# Patient Record
Sex: Female | Born: 1985 | Race: Black or African American | Hispanic: No | Smoking: Never smoker
Health system: Southern US, Community
[De-identification: ages and names within clinical notes are randomized; demographics above are authoritative.]

---
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
## 2009-10-27 ENCOUNTER — Emergency Department (HOSPITAL_COMMUNITY): Admission: EM | Admit: 2009-10-27 | Discharge: 2009-10-27 | Payer: Self-pay | Admitting: Emergency Medicine

## 2010-01-19 ENCOUNTER — Inpatient Hospital Stay (HOSPITAL_COMMUNITY)
Admission: AD | Admit: 2010-01-19 | Discharge: 2010-01-19 | Payer: Self-pay | Source: Home / Self Care | Attending: Obstetrics & Gynecology | Admitting: Obstetrics & Gynecology

## 2010-01-28 ENCOUNTER — Inpatient Hospital Stay (HOSPITAL_COMMUNITY)
Admission: AD | Admit: 2010-01-28 | Discharge: 2010-01-28 | Payer: Self-pay | Source: Home / Self Care | Attending: Family Medicine | Admitting: Family Medicine

## 2010-02-03 LAB — URINALYSIS, ROUTINE W REFLEX MICROSCOPIC
Bilirubin Urine: NEGATIVE
Ketones, ur: NEGATIVE mg/dL
Leukocytes, UA: NEGATIVE
Nitrite: NEGATIVE
Protein, ur: NEGATIVE mg/dL
Specific Gravity, Urine: 1.025 (ref 1.005–1.030)
Urine Glucose, Fasting: NEGATIVE mg/dL
Urobilinogen, UA: 0.2 mg/dL (ref 0.0–1.0)
pH: 6.5 (ref 5.0–8.0)

## 2010-02-03 LAB — URINE MICROSCOPIC-ADD ON

## 2010-02-03 LAB — WET PREP, GENITAL
Clue Cells Wet Prep HPF POC: NONE SEEN
Trich, Wet Prep: NONE SEEN
Yeast Wet Prep HPF POC: NONE SEEN

## 2010-02-10 ENCOUNTER — Inpatient Hospital Stay (HOSPITAL_COMMUNITY)
Admission: AD | Admit: 2010-02-10 | Discharge: 2010-02-10 | Payer: Self-pay | Source: Home / Self Care | Attending: Obstetrics and Gynecology | Admitting: Obstetrics and Gynecology

## 2010-02-11 LAB — URINE MICROSCOPIC-ADD ON

## 2010-02-11 LAB — WET PREP, GENITAL
Clue Cells Wet Prep HPF POC: NONE SEEN
Trich, Wet Prep: NONE SEEN
Yeast Wet Prep HPF POC: NONE SEEN

## 2010-02-11 LAB — URINALYSIS, ROUTINE W REFLEX MICROSCOPIC
Bilirubin Urine: NEGATIVE
Ketones, ur: NEGATIVE mg/dL
Nitrite: NEGATIVE
Protein, ur: NEGATIVE mg/dL
Specific Gravity, Urine: 1.025 (ref 1.005–1.030)
Urine Glucose, Fasting: NEGATIVE mg/dL
Urobilinogen, UA: 0.2 mg/dL (ref 0.0–1.0)
pH: 7.5 (ref 5.0–8.0)

## 2010-02-11 LAB — FETAL FIBRONECTIN: Fetal Fibronectin: NEGATIVE

## 2010-03-31 LAB — URINE MICROSCOPIC-ADD ON

## 2010-03-31 LAB — URINALYSIS, ROUTINE W REFLEX MICROSCOPIC
Glucose, UA: NEGATIVE mg/dL
Hgb urine dipstick: NEGATIVE
Ketones, ur: 15 mg/dL — AB
Nitrite: NEGATIVE
Protein, ur: NEGATIVE mg/dL
Specific Gravity, Urine: 1.02 (ref 1.005–1.030)
Urobilinogen, UA: 2 mg/dL — ABNORMAL HIGH (ref 0.0–1.0)
pH: 6 (ref 5.0–8.0)

## 2010-04-03 LAB — DIFFERENTIAL
Basophils Absolute: 0 10*3/uL (ref 0.0–0.1)
Basophils Relative: 0 % (ref 0–1)
Eosinophils Absolute: 0.1 10*3/uL (ref 0.0–0.7)
Eosinophils Relative: 1 % (ref 0–5)
Lymphocytes Relative: 19 % (ref 12–46)
Lymphs Abs: 2.2 10*3/uL (ref 0.7–4.0)
Monocytes Absolute: 1.2 10*3/uL — ABNORMAL HIGH (ref 0.1–1.0)
Monocytes Relative: 11 % (ref 3–12)
Neutro Abs: 8 10*3/uL — ABNORMAL HIGH (ref 1.7–7.7)
Neutrophils Relative %: 70 % (ref 43–77)

## 2010-04-03 LAB — URINALYSIS, ROUTINE W REFLEX MICROSCOPIC
Bilirubin Urine: NEGATIVE
Glucose, UA: NEGATIVE mg/dL
Hgb urine dipstick: NEGATIVE
Ketones, ur: NEGATIVE mg/dL
Nitrite: NEGATIVE
Protein, ur: NEGATIVE mg/dL
Specific Gravity, Urine: 1.013 (ref 1.005–1.030)
Urobilinogen, UA: 0.2 mg/dL (ref 0.0–1.0)
pH: 8.5 — ABNORMAL HIGH (ref 5.0–8.0)

## 2010-04-03 LAB — CBC
HCT: 34.2 % — ABNORMAL LOW (ref 36.0–46.0)
Hemoglobin: 11.5 g/dL — ABNORMAL LOW (ref 12.0–15.0)
MCH: 32.5 pg (ref 26.0–34.0)
MCHC: 33.6 g/dL (ref 30.0–36.0)
MCV: 96.6 fL (ref 78.0–100.0)
Platelets: 357 10*3/uL (ref 150–400)
RBC: 3.54 MIL/uL — ABNORMAL LOW (ref 3.87–5.11)
RDW: 12 % (ref 11.5–15.5)
WBC: 11.5 10*3/uL — ABNORMAL HIGH (ref 4.0–10.5)

## 2010-04-03 LAB — BASIC METABOLIC PANEL
BUN: 10 mg/dL (ref 6–23)
CO2: 24 mEq/L (ref 19–32)
Calcium: 8.9 mg/dL (ref 8.4–10.5)
Chloride: 104 mEq/L (ref 96–112)
Creatinine, Ser: 0.72 mg/dL (ref 0.4–1.2)
GFR calc Af Amer: >60 mL/min (ref >60)
GFR calc non Af Amer: >60 mL/min (ref >60)
Glucose, Bld: 85 mg/dL (ref 70–99)
Potassium: 3.5 mEq/L (ref 3.5–5.1)
Sodium: 133 mEq/L — ABNORMAL LOW (ref 135–145)

## 2010-04-03 LAB — HCG, QUANTITATIVE, PREGNANCY: hCG, Beta Chain, Quant, S: 196558 m[IU]/mL — ABNORMAL HIGH (ref <5)

## 2010-04-03 LAB — ABO/RH: ABO/RH(D): A POS

## 2010-04-27 ENCOUNTER — Inpatient Hospital Stay (HOSPITAL_COMMUNITY)
Admission: AD | Admit: 2010-04-27 | Discharge: 2010-04-28 | Disposition: A | Discharge status: Home / Self Care | Payer: BC Managed Care – HMO | Source: Ambulatory Visit | Attending: Obstetrics & Gynecology | Admitting: Obstetrics & Gynecology

## 2010-04-27 DIAGNOSIS — O99891 Other specified diseases and conditions complicating pregnancy: Secondary | ICD-10-CM | POA: Insufficient documentation

## 2010-04-27 DIAGNOSIS — Y92009 Unspecified place in unspecified non-institutional (private) residence as the place of occurrence of the external cause: Secondary | ICD-10-CM | POA: Insufficient documentation

## 2010-04-27 DIAGNOSIS — R109 Unspecified abdominal pain: Secondary | ICD-10-CM | POA: Insufficient documentation

## 2010-04-27 DIAGNOSIS — O9989 Other specified diseases and conditions complicating pregnancy, childbirth and the puerperium: Secondary | ICD-10-CM

## 2010-04-27 DIAGNOSIS — W010XXA Fall on same level from slipping, tripping and stumbling without subsequent striking against object, initial encounter: Secondary | ICD-10-CM | POA: Insufficient documentation

## 2010-04-27 LAB — COMPREHENSIVE METABOLIC PANEL
ALT: 20 U/L (ref 0–35)
AST: 22 U/L (ref 0–37)
Albumin: 2.7 g/dL — ABNORMAL LOW (ref 3.5–5.2)
Alkaline Phosphatase: 109 U/L (ref 39–117)
BUN: 11 mg/dL (ref 6–23)
CO2: 22 mEq/L (ref 19–32)
Calcium: 8.6 mg/dL (ref 8.4–10.5)
Chloride: 101 mEq/L (ref 96–112)
Creatinine, Ser: 0.73 mg/dL (ref 0.4–1.2)
GFR calc Af Amer: >60 mL/min (ref >60)
GFR calc non Af Amer: >60 mL/min (ref >60)
Glucose, Bld: 83 mg/dL (ref 70–99)
Potassium: 3.4 mEq/L — ABNORMAL LOW (ref 3.5–5.1)
Sodium: 131 mEq/L — ABNORMAL LOW (ref 135–145)
Total Bilirubin: 0.3 mg/dL (ref 0.3–1.2)
Total Protein: 6.1 g/dL (ref 6.0–8.3)

## 2010-04-27 LAB — CBC
HCT: 30.3 % — ABNORMAL LOW (ref 36.0–46.0)
Hemoglobin: 9.7 g/dL — ABNORMAL LOW (ref 12.0–15.0)
MCH: 28.8 pg (ref 26.0–34.0)
MCHC: 32 g/dL (ref 30.0–36.0)
MCV: 89.9 fL (ref 78.0–100.0)
Platelets: 311 10*3/uL (ref 150–400)
RBC: 3.37 MIL/uL — ABNORMAL LOW (ref 3.87–5.11)
RDW: 13.2 % (ref 11.5–15.5)
WBC: 12.5 10*3/uL — ABNORMAL HIGH (ref 4.0–10.5)

## 2010-04-27 LAB — URINALYSIS, ROUTINE W REFLEX MICROSCOPIC
Bilirubin Urine: NEGATIVE
Glucose, UA: NEGATIVE mg/dL
Hgb urine dipstick: NEGATIVE
Ketones, ur: NEGATIVE mg/dL
Nitrite: NEGATIVE
Protein, ur: NEGATIVE mg/dL
Specific Gravity, Urine: 1.02 (ref 1.005–1.030)
Urobilinogen, UA: 0.2 mg/dL (ref 0.0–1.0)
pH: 6 (ref 5.0–8.0)

## 2010-04-28 LAB — RAPID URINE DRUG SCREEN, HOSP PERFORMED
Amphetamines: NOT DETECTED
Barbiturates: NOT DETECTED
Benzodiazepines: NOT DETECTED
Cocaine: NOT DETECTED
Opiates: NOT DETECTED
Tetrahydrocannabinol: NOT DETECTED

## 2010-04-28 LAB — KLEIHAUER-BETKE STAIN
Fetal Cells %: 0 %
Quantitation Fetal Hemoglobin: 0 mL

## 2010-06-20 ENCOUNTER — Inpatient Hospital Stay (HOSPITAL_COMMUNITY)
Admission: EM | Admit: 2010-06-20 | Discharge: 2010-06-20 | Disposition: A | Discharge status: Home / Self Care | Payer: BC Managed Care – HMO | Source: Ambulatory Visit | Attending: Obstetrics & Gynecology | Admitting: Obstetrics & Gynecology

## 2010-06-20 DIAGNOSIS — N63 Unspecified lump in unspecified breast: Secondary | ICD-10-CM | POA: Insufficient documentation

## 2010-06-22 ENCOUNTER — Inpatient Hospital Stay (HOSPITAL_COMMUNITY)
Admission: AD | Admit: 2010-06-22 | Discharge: 2010-06-29 | DRG: 376 | Disposition: A | Discharge status: Home / Self Care | Payer: BC Managed Care – HMO | Source: Ambulatory Visit | Attending: Obstetrics and Gynecology | Admitting: Obstetrics and Gynecology

## 2010-06-22 DIAGNOSIS — O9112 Abscess of breast associated with the puerperium: Secondary | ICD-10-CM

## 2010-06-22 DIAGNOSIS — A4902 Methicillin resistant Staphylococcus aureus infection, unspecified site: Secondary | ICD-10-CM | POA: Diagnosis present

## 2010-06-22 DIAGNOSIS — O9122 Nonpurulent mastitis associated with the puerperium: Secondary | ICD-10-CM

## 2010-06-22 DIAGNOSIS — N61 Mastitis without abscess: Secondary | ICD-10-CM

## 2010-06-22 LAB — CBC
HCT: 30.2 % — ABNORMAL LOW (ref 36.0–46.0)
Hemoglobin: 9.4 g/dL — ABNORMAL LOW (ref 12.0–15.0)
MCH: 26.6 pg (ref 26.0–34.0)
MCHC: 31.1 g/dL (ref 30.0–36.0)
MCV: 85.6 fL (ref 78.0–100.0)
Platelets: 620 10*3/uL — ABNORMAL HIGH (ref 150–400)
RBC: 3.53 MIL/uL — ABNORMAL LOW (ref 3.87–5.11)
RDW: 15.2 % (ref 11.5–15.5)
WBC: 23.9 10*3/uL — ABNORMAL HIGH (ref 4.0–10.5)

## 2010-06-23 ENCOUNTER — Inpatient Hospital Stay (HOSPITAL_COMMUNITY): Payer: BC Managed Care – HMO

## 2010-06-23 LAB — CBC
HCT: 28.1 % — ABNORMAL LOW (ref 36.0–46.0)
Hemoglobin: 8.6 g/dL — ABNORMAL LOW (ref 12.0–15.0)
MCH: 26.3 pg (ref 26.0–34.0)
MCHC: 30.6 g/dL (ref 30.0–36.0)
MCV: 85.9 fL (ref 78.0–100.0)
Platelets: 621 10*3/uL — ABNORMAL HIGH (ref 150–400)
RBC: 3.27 MIL/uL — ABNORMAL LOW (ref 3.87–5.11)
RDW: 15.2 % (ref 11.5–15.5)
WBC: 22.9 10*3/uL — ABNORMAL HIGH (ref 4.0–10.5)

## 2010-06-23 LAB — MRSA PCR SCREENING: MRSA by PCR: POSITIVE — AB

## 2010-06-24 LAB — CREATININE, SERUM
Creatinine, Ser: 0.78 mg/dL (ref 0.4–1.2)
GFR calc non Af Amer: >60 mL/min (ref >60)

## 2010-06-24 LAB — URINE CULTURE
Colony Count: NO GROWTH
Culture: NO GROWTH

## 2010-06-25 ENCOUNTER — Other Ambulatory Visit: Payer: Self-pay | Admitting: General Surgery

## 2010-06-25 LAB — DIFFERENTIAL
Band Neutrophils: 3 % (ref 0–10)
Basophils Absolute: 0 10*3/uL (ref 0.0–0.1)
Basophils Relative: 0 % (ref 0–1)
Eosinophils Absolute: 0.5 10*3/uL (ref 0.0–0.7)
Eosinophils Relative: 3 % (ref 0–5)
Lymphs Abs: 3.2 10*3/uL (ref 0.7–4.0)
Myelocytes: 0 %
Neutro Abs: 13.3 10*3/uL — ABNORMAL HIGH (ref 1.7–7.7)
Neutrophils Relative %: 73 % (ref 43–77)

## 2010-06-25 LAB — CBC
Hemoglobin: 7.8 g/dL — ABNORMAL LOW (ref 12.0–15.0)
MCHC: 31.2 g/dL (ref 30.0–36.0)
RDW: 15.2 % (ref 11.5–15.5)
WBC: 17.5 10*3/uL — ABNORMAL HIGH (ref 4.0–10.5)

## 2010-06-25 LAB — CULTURE, ROUTINE-ABSCESS

## 2010-06-26 LAB — CBC
Hemoglobin: 7.2 g/dL — ABNORMAL LOW (ref 12.0–15.0)
Platelets: 524 10*3/uL — ABNORMAL HIGH (ref 150–400)
RBC: 2.82 MIL/uL — ABNORMAL LOW (ref 3.87–5.11)
WBC: 15.2 10*3/uL — ABNORMAL HIGH (ref 4.0–10.5)

## 2010-06-27 ENCOUNTER — Inpatient Hospital Stay (HOSPITAL_COMMUNITY): Payer: BC Managed Care – HMO

## 2010-06-27 LAB — DIFFERENTIAL
Blasts: 0 %
Eosinophils Absolute: 0.2 10*3/uL (ref 0.0–0.7)
Metamyelocytes Relative: 0 %
Myelocytes: 0 %
Promyelocytes Absolute: 0 %
nRBC: 0 /100 WBC

## 2010-06-27 LAB — CREATININE, SERUM
Creatinine, Ser: 0.84 mg/dL (ref 0.4–1.2)
GFR calc Af Amer: >60 mL/min (ref >60)
GFR calc non Af Amer: >60 mL/min (ref >60)

## 2010-06-27 LAB — CBC
Hemoglobin: 6.9 g/dL — CL (ref 12.0–15.0)
MCH: 26.1 pg (ref 26.0–34.0)
RBC: 2.64 MIL/uL — ABNORMAL LOW (ref 3.87–5.11)

## 2010-06-27 LAB — VANCOMYCIN, TROUGH: Vancomycin Tr: 15.4 ug/mL (ref 10.0–20.0)

## 2010-06-28 LAB — CBC
HCT: 23 % — ABNORMAL LOW (ref 36.0–46.0)
MCV: 85.5 fL (ref 78.0–100.0)
RDW: 15.6 % — ABNORMAL HIGH (ref 11.5–15.5)
WBC: 9.4 10*3/uL (ref 4.0–10.5)

## 2010-06-28 LAB — CULTURE, ROUTINE-ABSCESS

## 2010-06-29 LAB — CULTURE, BLOOD (ROUTINE X 2)
Culture  Setup Time: 201206040014
Culture: NO GROWTH

## 2010-06-30 LAB — ANAEROBIC CULTURE

## 2010-07-03 NOTE — Discharge Summary (Signed)
[name], [name]              ACCOUNT NO.:  [id number]  MEDICAL RECORD NO.:  [id number]  LOCATION:  9308                          FACILITY:  [facility]  PHYSICIAN:  Gurley Climer S. Shawnie Pons, M.D.   DATE OF BIRTH:  1985-04-01  DATE OF ADMISSION:  06/22/2010 DATE OF DISCHARGE:  06/29/2010                              DISCHARGE SUMMARY   FINAL DIAGNOSES: 1. Methicillin-resistant Staphylococcus aureus breast abscess. 2. History of incompetent cervix. 3. Status post cesarean delivery on May 31, 2010. 4. The patient is anemic.  CONSULTATIONS DONE THIS HOSPITALIZATION:  General Surgery and Infectious Disease.  PROCEDURES:  The patient underwent ultrasound-guided drainage of her breast abscess with was actually very little removed at that time.  This was followed by incision and drainage and debridement of breast abscess by General surgery on June 25, 2010.  PERTINENT LABS:  Initial white count of 23.9, final white count of 9.4, elevated platelet count of 620,000.  Final hemoglobin was 7.0.  Final urine culture was negative.  Blood cultures were negative of 5 days x2. Wound cultures were growing MRSA.  Other pertinent findings showed a initial ultrasound which had pus in them.  They were approximately 2 x 2 cm.  This was on June 4.  She had a second followup breast ultrasound done on June 8 which showed continued induration and edema, but no fluid collections which had been seen on the previous ultrasound.  Less than 5 mL were gotten at the time of that procedure.  Other pertinent findings include on initial presentation of the patient's breast was noted to have mastitis and extremely tender to palpation, erythematous that involved approximately one half of her breast.  Eventually a large 4-cm area of induration and fluctuance was noted at the breast and she was then taken for an I&D.  REASON FOR ADMISSION:  Briefly, please see H&P on the chart.  The patient was admitted on June 3 after a  planned breast pain.  She had previously been seen and put on Keflex for possible mastitis on June 1. The area of the breast in question proceeded to get worse.  She had fevers, chills, and came in.  Her present exam findings are as documented above.  For these reasons, she was admitted and placed on vancomycin.  HOSPITAL COURSE:  The patient was admitted.  She had a positive MRSA screen by nasal carriage and given a failed outpatient treatment with Keflex.  It was felt vancomycin was the best antibiotic for her.  She was started on vancomycin after that admission.  As areas of induration of the breast became fluctuant, it was felt she might be well served with drainage.  An ultrasound-guided drainage was performed.  She remained febrile and was spiking temperature close to 103 for many days after admission.  Since very little was obtained during ultrasound guidance, she remained febrile.  General Surgery was consulted and they eventually did an I&D on June 6.  She continued to spike and had a vancomycin trough drawn that showed levels to be too low.  Infectious Disease was consulted and felt like that was probably why she was spiking.  Prior to discharge, she was afebrile for  24 hours.  We were doing dressing changes at the site of the breast and the wound appeared well.  She had minimal purulent drainage.  The area of induration that was superior in the breasts had repeat ultrasound, showed no areas of fluid collection or focal pus.  The patient was discharged home with home health for dressing changes and per ID recommendations we will send her home with Bactrim x14 days.  DISCHARGE DISPOSITION:  The patient is discharged home in improved condition.  FOLLOWUP:  With General Surgery, Dr. Zachery Dakins in his office in a week or 2.  DISCHARGE MEDICATIONS:  Dilaudid 2 mg 1/2 to 1 every 6 hours as needed for pain and Bactrim double strength 1 p.o. b.i.d. x14 days.  Home health has been  arranged for dressing changes daily with iodoform gauze.     Shelbie Proctor. Shawnie Pons, M.D.     TSP/MEDQ  D:  06/29/2010  T:  06/29/2010  Job:  [id number]  Electronically Signed by Tinnie Gens M.D. on 07/03/2010 03:28:19 PM

## 2010-07-07 NOTE — Consult Note (Signed)
NAMEMarland Little  SHEWANDA, Little NO.:  [id number]  MEDICAL RECORD NO.:  [id number]  LOCATION:  9308                          FACILITY:  [facility]  PHYSICIAN:  Gardiner Barefoot, MD    DATE OF BIRTH:  Dec 14, 1985  DATE OF CONSULTATION:  06/27/2010 DATE OF DISCHARGE:                                CONSULTATION   Consult called by Dr. Catalina Antigua for persistent fevers.  HISTORY OF PRESENT ILLNESS:  This is a 25 year old female status post C- section approximately 1 month ago and initially breast-feeding in the first 2 weeks which was complicated by significant pain and cracking of her nipple skin.  [name] to significant pain, she did not continue breast- feeding but she noted significant pain in her nipple that persisted after stopping breast feeding.  She was then seen by her primary physician who diagnosed her with mastitis and started her on Keflex approximately 3 days prior to presentation.  Despite Keflex, she continued to have significant pain and noticed fever and came into the hospital.  Initially, she did have a temperature up to 102.8 and was admitted for IV antibiotics.  At that time, she was started on vancomycin and did have an ultrasound done on June 4 which did show a fluid collection and this was aspirated by Radiology.  This subsequently grew out MRSA.  She was then seen in consultation by Surgery on June 6 and taken to the operating room that day for an I and D.  The surgical report did note significant fluid accumulation and required significant irrigation and drainage.  The patient was then continued on vancomycin and she is now postop day #2.  She continues to complain of some pain and induration in her upper breast and has continued to have fever up to 103 last night and 101 today.  Her white count has improved from a WBC of 23.9 on admission down to 15.7 currently.  Consultation was called though, due to persistent fevers.  PAST MEDICAL HISTORY:  G5, P2 AB3  with an C-section on May 21, 2010. Anemia.  MEDICATIONS:  Currently, she is on vancomycin, dosed by pharmacy and pain medications.  ALLERGIES:  No known drug allergies.  SOCIAL HISTORY:  She does deny alcohol, tobacco, or drug use.  FAMILY HISTORY:  No immune disorders in her family.  REVIEW OF SYSTEMS:  Twelve-point review of systems was obtained and was negative except as per the history of present illness.  PHYSICAL EXAMINATION:  VITAL SIGNS:  Temperature currently is 101.5, T- max is 103; pulse 87; respirations 20; blood pressure is 143/70; and O2 saturation 97% on room air. GENERAL:  The patient is awake, alert, and oriented x3 and appears in no acute distress. CARDIOVASCULAR:  Regular rate and rhythm.  No murmurs, rubs, or gallops. LUNGS:  Clear to auscultation bilaterally. ABDOMEN:  Soft, nontender, and nondistended.  Positive bowel sounds.  No hepatosplenomegaly. SKIN:  Notable for incision in the right upper breast approximately 4-5 cm above the areola, serosanguineous drainage and packing noted.  No notable erythema.  There is notable induration on the superior aspect of the incision.  LABORATORY DATA:  Community-acquired MRSA from the ultrasound aspiration.  Perioperative cultures do show a few Staph aureus growing. WBC is 15.7 with 75% neutrophils.  Vancomycin trough is 15.4.  ASSESSMENT:  This is a 25 year old female with a breast abscess status post surgical incision and drainage, now postop day #2, with persistent fevers.  I do believe that she is slow to respond likely secondary to combination of a large fluid collection as well as subtherapeutic dose of vancomycin which has been corrected.  Additionally, she has continued to have some drainage and should likely have some residual pus remaining that is self draining.  I doubt there is any other loculated fluid collection.  RECOMMENDATION:  I would continue with the vancomycin at the current dose.  I also will  check an ultrasound to assure that there is no visible loculated fluid under the indurated area and otherwise, we will continue to observe on IV antibiotics to see if her fever does resolve.     Gardiner Barefoot, MD     RWC/MEDQ  D:  06/27/2010  T:  06/28/2010  Job:  [id number]  Electronically Signed by Staci Righter MD on 07/07/2010 11:18:43 AM

## 2010-07-29 NOTE — H&P (Signed)
NAMEKIKI, [name] NO.:  [id number]  MEDICAL RECORD NO.:  [id number]  LOCATION:  9308                          FACILITY:  [facility]  PHYSICIAN:  Anselm Pancoast. Alexya Mcdaris, M.D.DATE OF BIRTH:  [birth year]  DATE OF ADMISSION:  06/22/2010 DATE OF DISCHARGE:                             HISTORY & PHYSICAL   CHIEF COMPLAINT:  Right breast tenderness and mass.  HISTORY:  [name] is a 25 year old female who is a recent postpartum, nursed until approximately 2 weeks ago, and started having an  onset of breast tenderness last week.  She was seen in the GYN's office and placed on p.o. Keflex, but the Keflex did not appear to cause any improvement and then she returned to [facility] on a Sunday and was admitted for mastitis/possible abscess.  She had a section on May 24, 2010, stated that she nursed until approximately 2 weeks ago, so she would have only nursed about 2 weeks, and a surgical consultation was requested, and I saw her on Monday.  At that time, she had a markedly erythematous area in the breast with two areas that looked as if they are possibly developing an abscess, one was kind of the areolar edge anteriorly, the other one was much deeper, and she was admitted with fever of 102.8.  She was started on vancomycin since she had been on 48 hours of p.o. Keflex and then on Monday, her nose culture came back positive for MRSA.  I have asked Dr. Mayford Knife, the radiologist,  to please do an ultrasound to see if we could see whether there was a localized abscess developed and/or possibly two, and Dr. Mayford Knife said that she could not identify any abscess.  She did note a small amount of fluid from the nipple, and this was sent for culture, and it is growing MRSA.  Over the next 24 hours, her fever was originally down, but again last evening, she spiked to 102.8 and on examination, she has still got the markedly erythematous area.  There is an area at the areolar  edge that is obviously an abscess and then this is deeper area in the true breast parenchyma at the probably approximately 10 o'clock position. The patient did eat breakfast this morning, eggs and __________, and Anesthesia has been contacted, but they said that she will need to be n.p.o. for at least 6 hours, so I say on that the earliest we can take her to the operating room would be approximately 4:30.  The patient will be placed n.p.o.  She is on vancomycin 1 g IV q.8 h.  The patient understands that the planned procedure will be a drainage of this area. The wound will be left open and packed and it will be necessary for dressing changes and etc., and that it is very likely that she will not be able to nurse with the right breast since most of the time with these big abscesses the lacrimal duct will be interrupted in drainage of these areas.  I do not think a repeat ultrasound is needed, but I would not recommend trying to do this under local anesthesia as tender as it is and Anesthesia will determine  when we can take her exactly to the operating room.  I do not think any additional antibiotics are needed, and we will plan on 4:30 unless they feel that she can go to OR sooner. There is no evidence of any tenderness in the right breast, and her recent C-section incision appears to be healing without evidence of any inflammation.  The patient states that she does not have any known contact with people with MRSA infection, and she has not had a previous MRSA infection that she is aware of.    Anselm Pancoast. Zachery Dakins, M.D.    WJW/MEDQ  D:  06/25/2010  T:  06/26/2010  Job:  [id number]  Electronically Signed by Consuello Bossier M.D. on 07/29/2010 03:46:58 PM
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
## 2010-08-01 ENCOUNTER — Emergency Department (HOSPITAL_COMMUNITY): Payer: No Typology Code available for payment source

## 2010-08-01 ENCOUNTER — Emergency Department (HOSPITAL_COMMUNITY)
Admission: EM | Admit: 2010-08-01 | Discharge: 2010-08-01 | Disposition: A | Discharge status: Home / Self Care | Payer: No Typology Code available for payment source | Attending: General Surgery | Admitting: General Surgery

## 2010-08-01 ENCOUNTER — Encounter (INDEPENDENT_AMBULATORY_CARE_PROVIDER_SITE_OTHER): Payer: Self-pay | Admitting: General Surgery

## 2010-08-01 DIAGNOSIS — S92919A Unspecified fracture of unspecified toe(s), initial encounter for closed fracture: Secondary | ICD-10-CM | POA: Insufficient documentation

## 2010-08-01 DIAGNOSIS — R071 Chest pain on breathing: Secondary | ICD-10-CM | POA: Insufficient documentation

## 2010-08-01 DIAGNOSIS — IMO0002 Reserved for concepts with insufficient information to code with codable children: Secondary | ICD-10-CM | POA: Insufficient documentation

## 2010-08-01 DIAGNOSIS — M542 Cervicalgia: Secondary | ICD-10-CM | POA: Insufficient documentation

## 2010-08-01 DIAGNOSIS — S91309A Unspecified open wound, unspecified foot, initial encounter: Secondary | ICD-10-CM | POA: Insufficient documentation

## 2010-08-01 DIAGNOSIS — M79609 Pain in unspecified limb: Secondary | ICD-10-CM | POA: Insufficient documentation

## 2010-08-01 DIAGNOSIS — M25559 Pain in unspecified hip: Secondary | ICD-10-CM | POA: Insufficient documentation

## 2010-08-01 LAB — POCT I-STAT, CHEM 8
BUN: 6 mg/dL (ref 6–23)
Calcium, Ion: 1.11 mmol/L — ABNORMAL LOW (ref 1.12–1.32)
Chloride: 109 meq/L (ref 96–112)
Glucose, Bld: 99 mg/dL (ref 70–99)
HCT: 33 % — ABNORMAL LOW (ref 36.0–46.0)

## 2010-08-01 LAB — COMPREHENSIVE METABOLIC PANEL
ALT: 13 U/L (ref 0–35)
AST: 21 U/L (ref 0–37)
CO2: 19 mEq/L (ref 19–32)
Chloride: 105 mEq/L (ref 96–112)
GFR calc non Af Amer: >60 mL/min (ref >60)
Potassium: 3 mEq/L — ABNORMAL LOW (ref 3.5–5.1)
Sodium: 138 mEq/L (ref 135–145)
Total Bilirubin: 0.1 mg/dL — ABNORMAL LOW (ref 0.3–1.2)

## 2010-08-01 LAB — LACTIC ACID, PLASMA: Lactic Acid, Venous: 3.5 mmol/L — ABNORMAL HIGH (ref 0.5–2.2)

## 2010-08-01 LAB — CBC
HCT: 30.6 % — ABNORMAL LOW (ref 36.0–46.0)
MCV: 82.5 fL (ref 78.0–100.0)
RBC: 3.71 MIL/uL — ABNORMAL LOW (ref 3.87–5.11)
WBC: 10.2 10*3/uL (ref 4.0–10.5)

## 2010-11-07 ENCOUNTER — Emergency Department (HOSPITAL_COMMUNITY)
Admission: EM | Admit: 2010-11-07 | Discharge: 2010-11-07 | Disposition: A | Discharge status: Home / Self Care | Payer: BC Managed Care – HMO | Attending: Emergency Medicine | Admitting: Emergency Medicine

## 2010-11-07 DIAGNOSIS — H5789 Other specified disorders of eye and adnexa: Secondary | ICD-10-CM | POA: Insufficient documentation

## 2010-11-07 DIAGNOSIS — H5710 Ocular pain, unspecified eye: Secondary | ICD-10-CM | POA: Insufficient documentation

## 2010-11-07 DIAGNOSIS — H109 Unspecified conjunctivitis: Secondary | ICD-10-CM | POA: Insufficient documentation

## 2012-05-23 IMAGING — US US BREAST*R*
1 series · 13 of 15 positions shown · non-contrast
Comparison: None.

CLINICAL DATA: 24-year-old patient postpartum developed right
mastitis.  Evaluate for abscess.

RIGHT BREAST ULTRASOUND

[Series 1: us breast*right* · 13 of 15 slices shown]
[im 1/15]
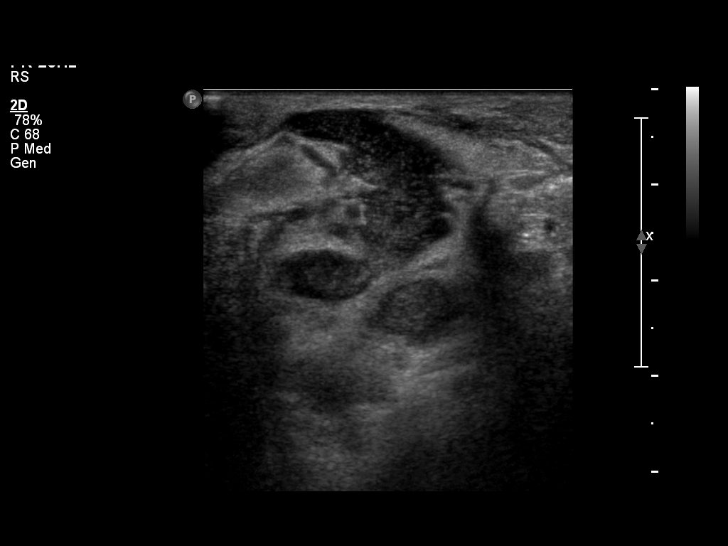
[im 2/15]
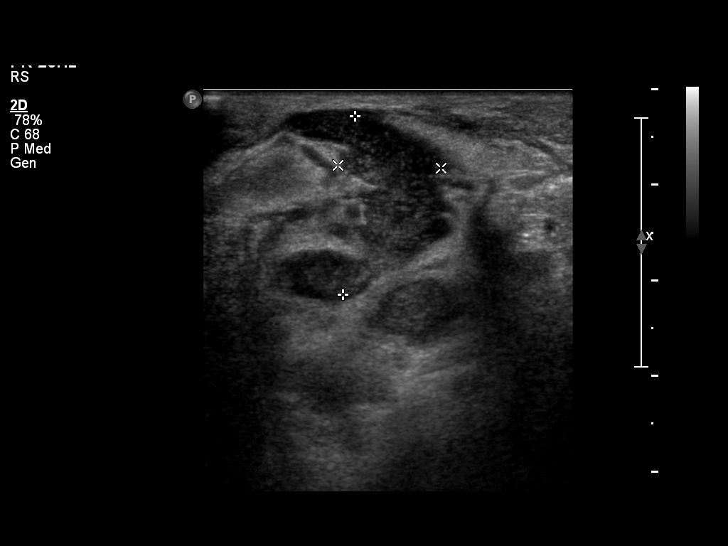
[im 3/15]
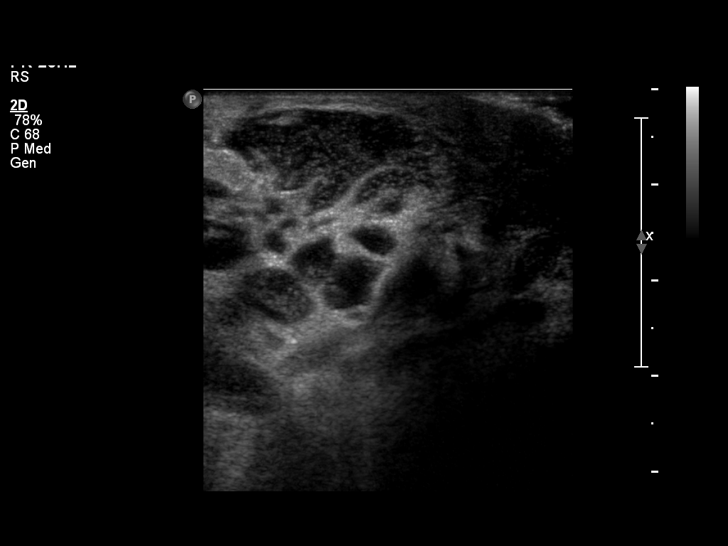
[im 5/15]
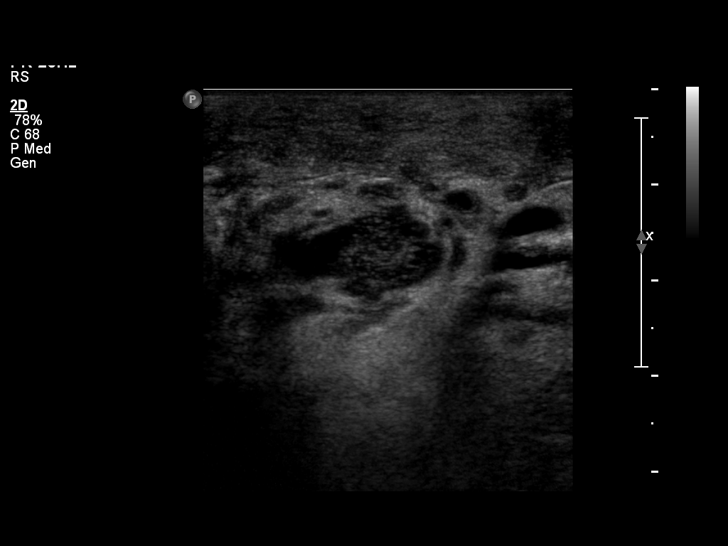
[im 6/15]
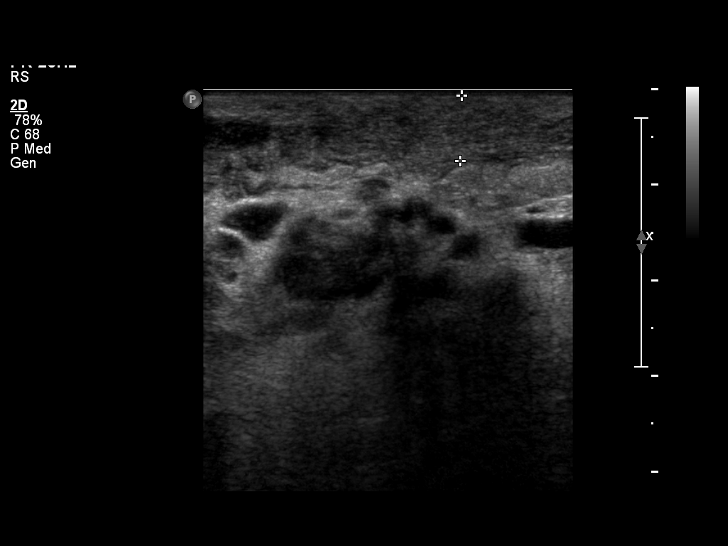
[im 7/15]
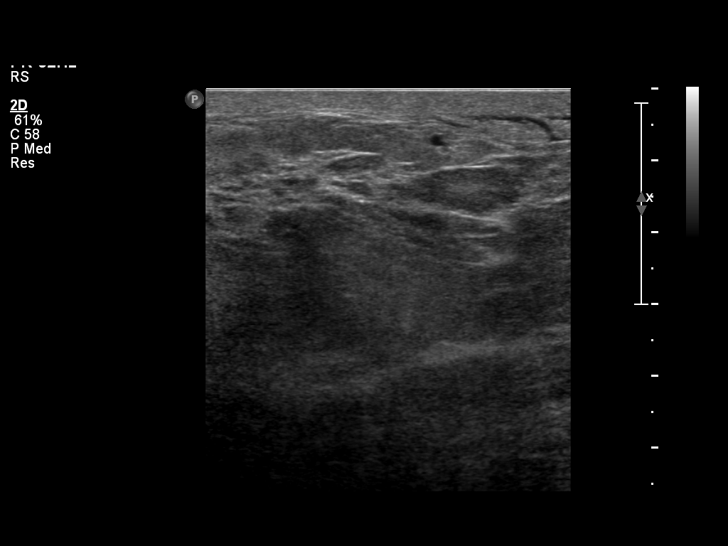
[im 8/15]
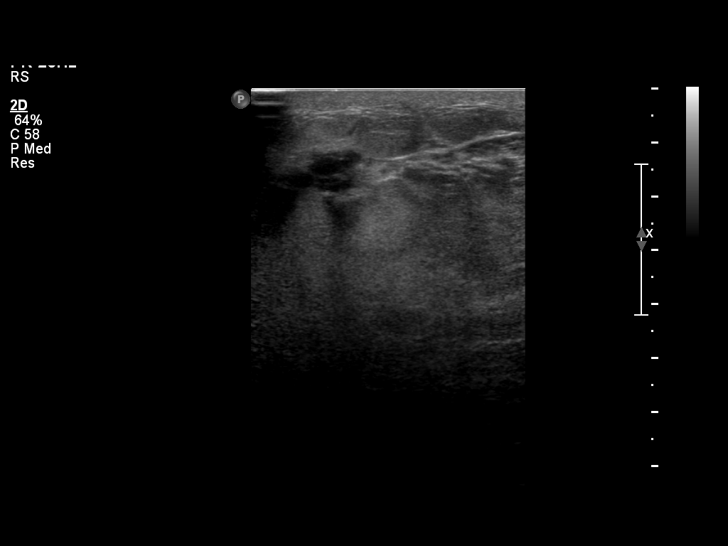
[im 9/15]
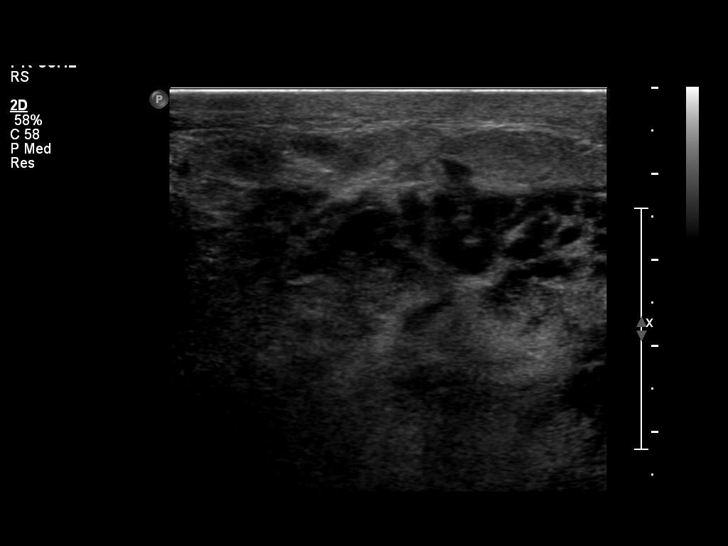
[im 10/15]
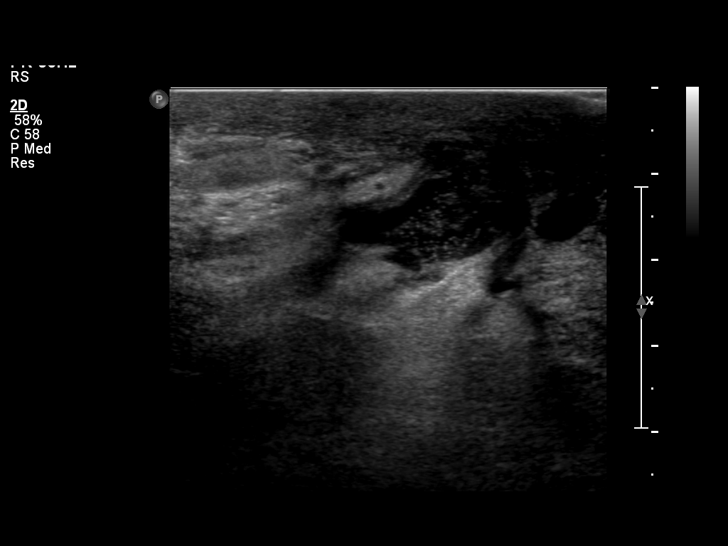
[im 11/15]
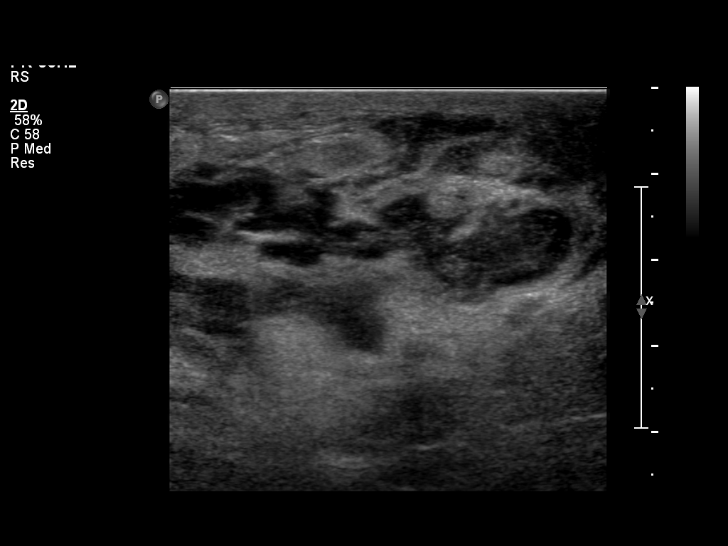
[im 13/15]
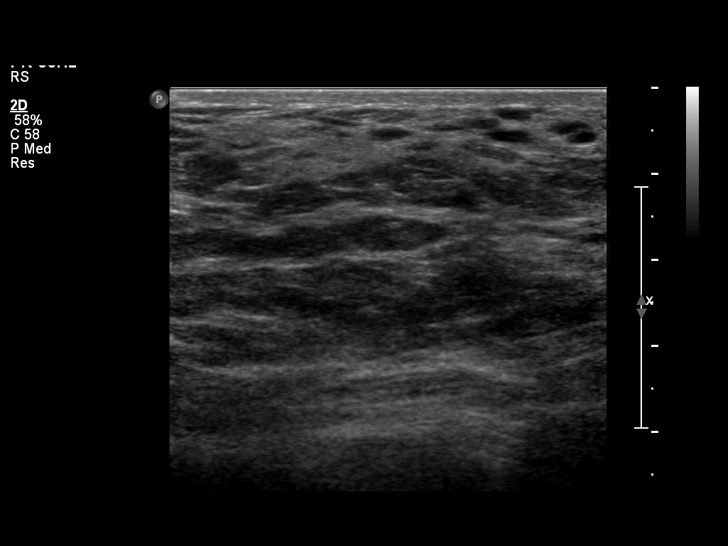
[im 14/15]
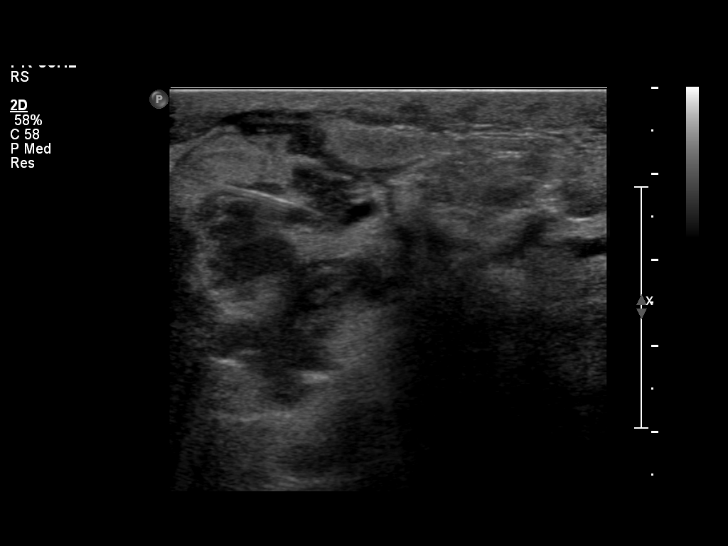
[im 15/15]
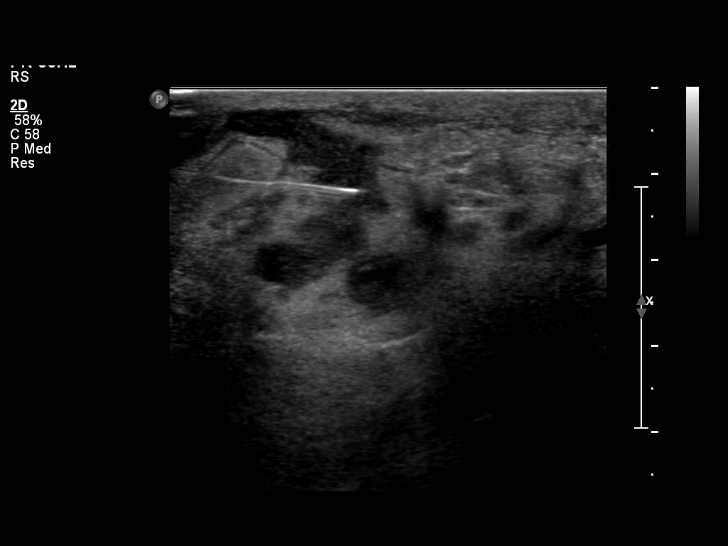

[13 of 15 positions shown; findings below may reference images not displayed]

On physical exam, the upper central and upper outer right breast is
firm to palpation, most prominent in the periareolar and anterior
region.  This skin demonstrates some edematous changes and is more
erythematous than the left breast.
FINDINGS: Ultrasound is performed, showing dilated subareolar
ducts, some of which contain swirling mobile echogenic particles,
favored to be due to pus, given the clinical setting.  The most
prominent area of mobile echogenic material within the dilated
ducts is in the [DATE] position periareolar, approximately 2 cm from
the nipple, that measures 1.9 x 1.1 x 1.9 cm.  Debris within
dilated ducts is also noted at 9 o'clock periareolar, at 10 o'clock
periareolar.  The periareolar right breast skin is thickened,
measuring up to 7 mm.

Ultrasound of the upper outer quadrant 10 o'clock position 8 cm
from the nipple, in the periphery of the area of concern on
physical exam shows mildly prominent ducts without debris.  There
is skin thickening.

A discrete abscess pocket within the breast parenchyma is not
identified.  No solid mass is seen.

For comparison purposes, ultrasound in the periareolar LEFT
(opposite) best breast at 12 o'clock position subareolar was
performed. This shows normal breast parenchyma with no dilated
ducts.
IMPRESSION: Findings consistent with mastitis, and probable pus within dilated
periareolar dilated ducts ( duct ectasia) of the right breast.  A
discrete abscess within the breast parenchyma is not identified. An
attempt will be made to aspirate some of the complex fluid within
the dilated ducts in the periareolar right breast to send for
culture.

Follow-up right breast ultrasound could be performed, in
approximately 2 weeks, to evaluate for improvement or progression
of these findings, or sooner as clinically indicated.

BI-RADS CATEGORY 3:  Probably benign finding(s) - short interval
follow-up suggested.
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
## 2017-08-18 ENCOUNTER — Other Ambulatory Visit: Payer: Self-pay

## 2017-08-18 ENCOUNTER — Emergency Department (HOSPITAL_BASED_OUTPATIENT_CLINIC_OR_DEPARTMENT_OTHER)
Admission: EM | Admit: 2017-08-18 | Discharge: 2017-08-18 | Disposition: A | Discharge status: Home / Self Care | Payer: Medicaid Other | Attending: Emergency Medicine | Admitting: Emergency Medicine

## 2017-08-18 ENCOUNTER — Encounter (HOSPITAL_BASED_OUTPATIENT_CLINIC_OR_DEPARTMENT_OTHER): Payer: Self-pay | Admitting: Emergency Medicine

## 2017-08-18 DIAGNOSIS — N764 Abscess of vulva: Secondary | ICD-10-CM | POA: Diagnosis not present

## 2017-08-18 DIAGNOSIS — N7689 Other specified inflammation of vagina and vulva: Secondary | ICD-10-CM | POA: Diagnosis present

## 2017-08-18 MED ORDER — CEPHALEXIN 500 MG PO CAPS: 500.0000 mg | ORAL_CAPSULE | Freq: Four times a day (QID) | ORAL | 0 refills | Status: AC

## 2017-08-18 NOTE — ED Triage Notes (Signed)
Reports c-section on July 4.  Patient has staples in and would like these removed.  Also c/o abscess to right side of vagina.

## 2017-08-18 NOTE — ED Provider Notes (Signed)
[facility] EMERGENCY DEPARTMENT Provider Note   CSN: [id number] Arrival date & time: 08/18/17  1523     History   Chief Complaint Chief Complaint  Patient presents with  . Abscess  . Suture / Staple Removal    HPI [name] is a 32 y.o. female.  She had a C-section for twins about 4 weeks ago.  She is concerned she has a retained stitch that she needs taken out.  She is also had a new swelling of her right labia that is been painful over the past few days.  Is been draining a little purulent fluid.  There is been no fever no urinary symptoms and no other complaints.  The abscess itself she says it is moderate to severe in nature worse with walking and improved with laying still.  She is never had 1 of these before.  There is been no fevers or chills  The history is provided by the patient.  Abscess  Location:  Pelvis Pelvic abscess location:  Vulva Size:  3 Abscess quality: draining, fluctuance, induration, painful and redness   Red streaking: no   Progression:  Worsening Pain details:    Quality:  Throbbing   Severity:  Severe   Timing:  Constant   Progression:  Worsening Chronicity:  New Context: not diabetes, not immunosuppression, not injected drug use and not skin injury   Relieved by:  None tried Worsened by:  Draining/squeezing Associated symptoms: no fever, no nausea and no vomiting     History reviewed. No pertinent past medical history.  There are no active problems to display for this patient.   History reviewed. No pertinent surgical history.   OB History   None      Home Medications    Prior to Admission medications   Not on File    Family History History reviewed. No pertinent family history.  Social History Social History   Tobacco Use  . Smoking status: Never Smoker  . Smokeless tobacco: Never Used  Substance Use Topics  . Alcohol use: Yes  . Drug use: Never     Allergies   Patient has no known  allergies.   Review of Systems Review of Systems  Constitutional: Negative for fever.  HENT: Negative for sore throat.   Eyes: Negative for visual disturbance.  Respiratory: Negative for shortness of breath.   Cardiovascular: Negative for chest pain.  Gastrointestinal: Negative for abdominal pain, nausea and vomiting.  Genitourinary: Positive for vaginal pain. Negative for dysuria and vaginal bleeding.  Skin: Negative for rash.     Physical Exam Updated Vital Signs BP 115/75   Pulse 79   Temp 98.4 F (36.9 C)   Resp 16   Ht 5\' 2"  (1.575 m)   SpO2 100%   Physical Exam  Constitutional: She appears well-developed and well-nourished.  HENT:  Head: Normocephalic and atraumatic.  Eyes: Conjunctivae are normal.  Neck: Neck supple.  Cardiovascular: Normal rate and regular rhythm.  Pulmonary/Chest: Effort normal and breath sounds normal.  Abdominal: Soft. There is no tenderness. There is no guarding.  Genitourinary:     Musculoskeletal: Normal range of motion.  Neurological: She is alert. GCS eye subscore is 4. GCS verbal subscore is 5. GCS motor subscore is 6.  Skin: Skin is warm and dry.  Psychiatric: She has a normal mood and affect.     ED Treatments / Results  Labs (all labs ordered are listed, but only abnormal results are displayed) Labs Reviewed -  No data to display  EKG None  Radiology No results found.  Procedures .Marland Kitchen.Incision and Drainage Date/Time: 08/18/2017 4:09 PM Performed by: Terrilee FilesButler, Denisha Hoel C, MD Authorized by: Terrilee FilesButler, Kenslee Achorn C, MD   Consent:    Consent obtained:  Verbal   Consent given by:  Patient   Risks discussed:  Bleeding, incomplete drainage, pain and infection   Alternatives discussed:  No treatment, delayed treatment and referral Location:    Type:  Abscess   Size:  3   Location:  Anogenital   Anogenital location:  Vulva Pre-procedure details:    Skin preparation:  Betadine Anesthesia (see MAR for exact dosages):     Anesthesia method:  Local infiltration   Local anesthetic:  Lidocaine 1% w/o epi Procedure type:    Complexity:  Simple Procedure details:    Incision types:  Single straight   Scalpel blade:  15   Wound management:  Probed and deloculated   Drainage:  Purulent   Drainage amount:  Moderate   Wound treatment:  Wound left open Post-procedure details:    Patient tolerance of procedure:  Tolerated well, no immediate complications   (including critical care time)  Medications Ordered in ED Medications - No data to display   Initial Impression / Assessment and Plan / ED Course  I have reviewed the triage vital signs and the nursing notes.  Pertinent labs & imaging results that were available during my care of the patient were reviewed by me and considered in my medical decision making (see chart for details).      Final Clinical Impressions(s) / ED Diagnoses   Final diagnoses:  Vulvar abscess    ED Discharge Orders        Ordered    cephALEXin (KEFLEX) 500 MG capsule  4 times daily     08/18/17 1610       Terrilee FilesButler, Sandee Bernath C, MD 08/19/17 [phone]

## 2017-08-18 NOTE — Discharge Instructions (Signed)
Your evaluated in the emergency department for concerns of a retained stitch and also an abscess on your vulva.  The stitches dissolvable and will slowly go away over time.  The abscess was drained here in the got a lot of thick infected fluid out of it.  We are prescribing antibiotics to take.  You should use Tylenol and ibuprofen for pain.  You should also do sitz baths where you are in the tub and gently massage that area.  Please return if any concerns.

## 2023-08-18 ENCOUNTER — Emergency Department (HOSPITAL_BASED_OUTPATIENT_CLINIC_OR_DEPARTMENT_OTHER)
Admission: EM | Admit: 2023-08-18 | Discharge: 2023-08-18 | Disposition: A | Discharge status: Home / Self Care | Attending: Emergency Medicine | Admitting: Emergency Medicine

## 2023-08-18 ENCOUNTER — Other Ambulatory Visit: Payer: Self-pay

## 2023-08-18 ENCOUNTER — Emergency Department (HOSPITAL_BASED_OUTPATIENT_CLINIC_OR_DEPARTMENT_OTHER)

## 2023-08-18 ENCOUNTER — Encounter (HOSPITAL_BASED_OUTPATIENT_CLINIC_OR_DEPARTMENT_OTHER): Payer: Self-pay | Admitting: Radiology

## 2023-08-18 DIAGNOSIS — M79644 Pain in right finger(s): Secondary | ICD-10-CM | POA: Diagnosis not present

## 2023-08-18 DIAGNOSIS — M79641 Pain in right hand: Secondary | ICD-10-CM | POA: Diagnosis present

## 2023-08-18 MED ORDER — KETOROLAC TROMETHAMINE 15 MG/ML IJ SOLN
15.0000 mg | Freq: Once | INTRAMUSCULAR | Status: AC
  Administered 2023-08-18: 15 mg via INTRAMUSCULAR
  Filled 2023-08-18: qty 1
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
## 2023-08-18 NOTE — Discharge Instructions (Signed)
 Today you were seen for right hand pain.  I suspect you likely have a finger sprain.  Please rest, ice, compress, and elevate your hand.  You may alternate taking Tylenol/Motrin as needed for pain and swelling.  Please follow-up with your PCP if your symptoms persist for further evaluation and workup.  Thank you for letting us  treat you today. After reviewing your  imaging, I feel you are safe to go home. Please follow up with your PCP in the next several days and provide them with your records from this visit. Return to the Emergency Room if pain becomes severe or symptoms worsen.
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
